# Patient Record
Sex: Female | Born: 1975 | Race: White | Hispanic: No | Marital: Married | Smoking: Never smoker
Health system: Southern US, Community
[De-identification: ages and names within clinical notes are randomized; demographics above are authoritative.]

## PROBLEM LIST (undated history)

## (undated) DIAGNOSIS — G43909 Migraine, unspecified, not intractable, without status migrainosus: Secondary | ICD-10-CM

## (undated) DIAGNOSIS — B20 Human immunodeficiency virus [HIV] disease: Secondary | ICD-10-CM

## (undated) DIAGNOSIS — M358 Other specified systemic involvement of connective tissue: Secondary | ICD-10-CM

## (undated) DIAGNOSIS — E079 Disorder of thyroid, unspecified: Secondary | ICD-10-CM

---

## 2004-10-04 ENCOUNTER — Other Ambulatory Visit: Admission: RE | Admit: 2004-10-04 | Discharge: 2004-10-04 | Payer: Self-pay | Admitting: Obstetrics and Gynecology

## 2005-09-05 ENCOUNTER — Other Ambulatory Visit: Admission: RE | Admit: 2005-09-05 | Discharge: 2005-09-05 | Payer: Self-pay | Admitting: Obstetrics and Gynecology

## 2006-03-27 ENCOUNTER — Inpatient Hospital Stay (HOSPITAL_COMMUNITY): Admission: AD | Admit: 2006-03-27 | Discharge: 2006-03-30 | Payer: Self-pay | Admitting: Obstetrics and Gynecology

## 2006-06-01 ENCOUNTER — Ambulatory Visit (HOSPITAL_COMMUNITY): Admission: RE | Admit: 2006-06-01 | Discharge: 2006-06-01 | Payer: Self-pay | Admitting: Obstetrics and Gynecology

## 2006-06-14 ENCOUNTER — Encounter: Admission: RE | Admit: 2006-06-14 | Discharge: 2006-06-14 | Payer: Self-pay | Admitting: Obstetrics and Gynecology

## 2006-07-17 ENCOUNTER — Encounter: Admission: RE | Admit: 2006-07-17 | Discharge: 2006-07-17 | Payer: Self-pay | Admitting: Endocrinology

## 2008-10-20 ENCOUNTER — Inpatient Hospital Stay (HOSPITAL_COMMUNITY): Admission: AD | Admit: 2008-10-20 | Discharge: 2008-10-22 | Payer: Self-pay | Admitting: Obstetrics and Gynecology

## 2009-02-02 ENCOUNTER — Emergency Department (HOSPITAL_BASED_OUTPATIENT_CLINIC_OR_DEPARTMENT_OTHER): Admission: EM | Admit: 2009-02-02 | Discharge: 2009-02-02 | Payer: Self-pay | Admitting: Emergency Medicine

## 2010-08-30 LAB — COMPREHENSIVE METABOLIC PANEL
ALT: 20 U/L (ref 0–35)
AST: 31 U/L (ref 0–37)
Albumin: 2.9 g/dL — ABNORMAL LOW (ref 3.5–5.2)
Alkaline Phosphatase: 184 U/L — ABNORMAL HIGH (ref 39–117)
CO2: 28 mEq/L (ref 19–32)
Chloride: 101 mEq/L (ref 96–112)
GFR calc non Af Amer: >60 mL/min (ref >60)
Glucose, Bld: 98 mg/dL (ref 70–99)
Potassium: 4.7 mEq/L (ref 3.5–5.1)
Sodium: 137 mEq/L (ref 135–145)

## 2010-08-30 LAB — CBC
HCT: 35.7 % — ABNORMAL LOW (ref 36.0–46.0)
HCT: 36.4 % (ref 36.0–46.0)
Hemoglobin: 12.7 g/dL (ref 12.0–15.0)
MCHC: 35 g/dL (ref 30.0–36.0)
Platelets: 135 10*3/uL — ABNORMAL LOW (ref 150–400)
RBC: 3.47 MIL/uL — ABNORMAL LOW (ref 3.87–5.11)
RDW: 13.3 % (ref 11.5–15.5)
WBC: 9.9 10*3/uL (ref 4.0–10.5)

## 2010-08-31 LAB — CBC
HCT: 42.8 % (ref 36.0–46.0)
MCHC: 35.3 g/dL (ref 30.0–36.0)
RBC: 4.23 MIL/uL (ref 3.87–5.11)
RDW: 13.3 % (ref 11.5–15.5)

## 2012-02-20 ENCOUNTER — Other Ambulatory Visit: Payer: Self-pay | Admitting: Obstetrics and Gynecology

## 2012-02-20 DIAGNOSIS — N6009 Solitary cyst of unspecified breast: Secondary | ICD-10-CM

## 2012-02-23 ENCOUNTER — Ambulatory Visit
Admission: RE | Admit: 2012-02-23 | Discharge: 2012-02-23 | Disposition: A | Discharge status: Home / Self Care | Payer: 59 | Source: Ambulatory Visit | Attending: Obstetrics and Gynecology | Admitting: Obstetrics and Gynecology

## 2012-02-23 ENCOUNTER — Other Ambulatory Visit: Payer: Self-pay | Admitting: Obstetrics and Gynecology

## 2012-02-23 DIAGNOSIS — N6009 Solitary cyst of unspecified breast: Secondary | ICD-10-CM

## 2014-10-11 ENCOUNTER — Encounter (HOSPITAL_BASED_OUTPATIENT_CLINIC_OR_DEPARTMENT_OTHER): Payer: Self-pay

## 2014-10-11 ENCOUNTER — Emergency Department (HOSPITAL_BASED_OUTPATIENT_CLINIC_OR_DEPARTMENT_OTHER)
Admission: EM | Admit: 2014-10-11 | Discharge: 2014-10-12 | Disposition: A | Discharge status: Home / Self Care | Payer: BLUE CROSS/BLUE SHIELD | Attending: Emergency Medicine | Admitting: Emergency Medicine

## 2014-10-11 DIAGNOSIS — Z3202 Encounter for pregnancy test, result negative: Secondary | ICD-10-CM | POA: Insufficient documentation

## 2014-10-11 DIAGNOSIS — R51 Headache: Secondary | ICD-10-CM

## 2014-10-11 DIAGNOSIS — R42 Dizziness and giddiness: Secondary | ICD-10-CM | POA: Diagnosis present

## 2014-10-11 DIAGNOSIS — Z880 Allergy status to penicillin: Secondary | ICD-10-CM | POA: Diagnosis not present

## 2014-10-11 DIAGNOSIS — H81392 Other peripheral vertigo, left ear: Secondary | ICD-10-CM | POA: Diagnosis not present

## 2014-10-11 DIAGNOSIS — Z79899 Other long term (current) drug therapy: Secondary | ICD-10-CM | POA: Insufficient documentation

## 2014-10-11 DIAGNOSIS — G43909 Migraine, unspecified, not intractable, without status migrainosus: Secondary | ICD-10-CM | POA: Diagnosis not present

## 2014-10-11 DIAGNOSIS — Z8639 Personal history of other endocrine, nutritional and metabolic disease: Secondary | ICD-10-CM | POA: Insufficient documentation

## 2014-10-11 DIAGNOSIS — Z7951 Long term (current) use of inhaled steroids: Secondary | ICD-10-CM | POA: Insufficient documentation

## 2014-10-11 DIAGNOSIS — R519 Headache, unspecified: Secondary | ICD-10-CM

## 2014-10-11 HISTORY — DX: Other specified systemic involvement of connective tissue: M35.8

## 2014-10-11 HISTORY — DX: Human immunodeficiency virus (HIV) disease: B20

## 2014-10-11 HISTORY — DX: Migraine, unspecified, not intractable, without status migrainosus: G43.909

## 2014-10-11 HISTORY — DX: Disorder of thyroid, unspecified: E07.9

## 2014-10-11 LAB — PREGNANCY, URINE: Preg Test, Ur: NEGATIVE

## 2014-10-11 LAB — URINALYSIS, ROUTINE W REFLEX MICROSCOPIC
Bilirubin Urine: NEGATIVE
GLUCOSE, UA: NEGATIVE mg/dL
Hgb urine dipstick: NEGATIVE
Nitrite: NEGATIVE
Protein, ur: NEGATIVE mg/dL
SPECIFIC GRAVITY, URINE: 1.023 (ref 1.005–1.030)
UROBILINOGEN UA: 0.2 mg/dL (ref 0.0–1.0)
pH: 7.5 (ref 5.0–8.0)

## 2014-10-11 LAB — URINE MICROSCOPIC-ADD ON

## 2014-10-11 MED ORDER — KETOROLAC TROMETHAMINE 30 MG/ML IJ SOLN
30.0000 mg | Freq: Once | INTRAMUSCULAR | Status: AC
  Administered 2014-10-11: 30 mg via INTRAVENOUS
  Filled 2014-10-11: qty 1

## 2014-10-11 MED ORDER — ONDANSETRON HCL 4 MG/2ML IJ SOLN
4.0000 mg | Freq: Once | INTRAMUSCULAR | Status: AC
Start: 2014-10-11 — End: 2014-10-11
  Administered 2014-10-11: 4 mg via INTRAVENOUS
  Filled 2014-10-11: qty 2

## 2014-10-11 MED ORDER — METOCLOPRAMIDE HCL 5 MG/ML IJ SOLN
10.0000 mg | Freq: Once | INTRAMUSCULAR | Status: AC
  Administered 2014-10-11: 10 mg via INTRAVENOUS
  Filled 2014-10-11: qty 2

## 2014-10-11 MED ORDER — DIPHENHYDRAMINE HCL 50 MG/ML IJ SOLN
12.5000 mg | Freq: Once | INTRAMUSCULAR | Status: AC
  Administered 2014-10-11: 12.5 mg via INTRAVENOUS
  Filled 2014-10-11: qty 1

## 2014-10-11 NOTE — ED Notes (Signed)
Pt reports dizziness and n/v.  States head hurts but does not think this is migraine b/c dizziness started first.  Dizziness worse with movement.

## 2014-10-11 NOTE — ED Provider Notes (Signed)
CSN: [id number]     Arrival date & time 10/11/14  1933 History   First MD Initiated Contact with Patient 10/11/14 2106     Chief Complaint  Patient presents with  . Dizziness     (Consider location/radiation/quality/duration/timing/severity/associated sxs/prior Treatment) HPI   Bridget Ross is a 39 y.o. female complaining of hue to onset of sensation of falling with nausea and vomiting especially when she turns her head to the left onset at 3:30 PM today followed by global headache which she describes as throbbing, she is light sensitive, she rates her pain at 9 out of 10, patient initially tried to take a Motrin but she vomited all medication. Patient states the headache reach maximal intensity within 30 minutes, she states that the headache is typical however it is normally not exacerbated by head movement and she normally doesn't have vomiting and dizziness before her head moves. She denies vertigo but described as a sensation of falling and disequilibrium. Pt denies fever, rash, confusion, cervicalgia, LOC/syncope, change in vision, N/V, numbness, weakness, dysarthria, ataxia, thunderclap onset, exacerbation with exertion or valsalva, exacerbation in morning, CP, SOB, abdominal pain.   Past Medical History  Diagnosis Date  . Migraine   . Thyroid disease   . Autoimmune deficiency syndrome    History reviewed. No pertinent past surgical history. No family history on file. History  Substance Use Topics  . Smoking status: Never Smoker   . Smokeless tobacco: Not on file  . Alcohol Use: No   OB History    No data available     Review of Systems  10 systems reviewed and found to be negative, except as noted in the HPI.   Allergies  Penicillins  Home Medications   Prior to Admission medications   Medication Sig Start Date End Date Taking? Authorizing Provider  cetirizine (ZYRTEC) 10 MG tablet Take 10 mg by mouth daily.   Yes Historical Provider, MD  fluticasone  (FLONASE) 50 MCG/ACT nasal spray Place into both nostrils daily.   Yes Historical Provider, MD  Multiple Vitamin (MULTIVITAMIN) tablet Take 1 tablet by mouth daily.   Yes Historical Provider, MD  metoCLOPramide (REGLAN) 10 MG tablet Take 1 tablet (10 mg total) by mouth every 6 (six) hours as needed for nausea (nausea/headache). 10/12/14   Nicole Pisciotta, PA-C   BP 107/71 mmHg  Pulse 78  Temp(Src) 98.3 F (36.8 C) (Oral)  Resp 16  Ht  (1.727 m)  Wt 125 lb (56.7 kg)  BMI 19.01 kg/m2  SpO2 100%  LMP 09/21/2014 Physical Exam  Constitutional: She is oriented to person, place, and time. She appears well-developed and well-nourished.  HENT:  Head: Normocephalic and atraumatic.  Mouth/Throat: Oropharynx is clear and moist.  Eyes: Conjunctivae and EOM are normal. Pupils are equal, round, and reactive to light.  Neck: Normal range of motion. Neck supple.  FROM to C-spine. Pt can touch chin to chest without discomfort. No TTP of midline cervical spine.   Cardiovascular: Normal rate, regular rhythm and intact distal pulses.   Pulmonary/Chest: Effort normal and breath sounds normal. No respiratory distress. She has no wheezes. She has no rales. She exhibits no tenderness.  Abdominal: Soft. Bowel sounds are normal. There is no tenderness.  Musculoskeletal: Normal range of motion. She exhibits no edema or tenderness.  Neurological: She is alert and oriented to person, place, and time. No cranial nerve deficit.  II-Visual fields grossly intact. III/IV/VI-Extraocular movements intact.  Pupils reactive bilaterally. V/VII-Smile symmetric, equal eyebrow raise,  facial sensation intact VIII- Hearing grossly intact IX/X-Normal gag XI-bilateral shoulder shrug XII-midline tongue extension Motor: 5/5 bilaterally with normal tone and bulk Cerebellar: Normal finger-to-nose  and normal heel-to-shin test.   Romberg negative   Dix-Hallpike is negative bilaterally   Nursing note and vitals  reviewed.   ED Course  Procedures (including critical care time) Labs Review Labs Reviewed  URINALYSIS, ROUTINE W REFLEX MICROSCOPIC - Abnormal; Notable for the following:    Ketones, ur >80 (*)    Leukocytes, UA SMALL (*)    All other components within normal limits  URINE MICROSCOPIC-ADD ON - Abnormal; Notable for the following:    Squamous Epithelial / LPF FEW (*)    Bacteria, UA MANY (*)    All other components within normal limits  PREGNANCY, URINE    Imaging Review No results found.   EKG Interpretation None      MDM   Final diagnoses:  Peripheral vertigo involving left ear  Nonintractable headache, unspecified chronicity pattern, unspecified headache type    Filed Vitals:   10/11/14 2001 10/11/14 2130 10/11/14 2201 10/12/14 0119  BP: 120/82 111/71 117/72 107/71  Pulse: 80 84 85 78  Temp:    98.3 F (36.8 C)  TempSrc:    Oral  Resp: 18 16 18 16   Height:      Weight:      SpO2: 100% 100% 100% 100%    Medications  ondansetron (ZOFRAN) injection 4 mg (4 mg Intravenous Given 10/11/14 2248)  diphenhydrAMINE (BENADRYL) injection 12.5 mg (12.5 mg Intravenous Given 10/11/14 2253)  metoCLOPramide (REGLAN) injection 10 mg (10 mg Intravenous Given 10/11/14 2248)  ketorolac (TORADOL) 30 MG/ML injection 30 mg (30 mg Intravenous Given 10/11/14 2252)    Bridget Ross is a pleasant 39 y.o. female presenting with dizziness which she described as a falling sensation when she turns her head to the left associated with nausea and several episodes of vomiting. Neuro exam is nonfocal, I believe this to be a peripheral vertigo, I think this is setting off a typical migraine for her. After patient is given a headache cocktail she reports complete resolution of her symptoms. I prepped out some information for her on the Apley maneuver patient has a nose and throat physician that she will follow with. We've had an extensive discussion of return precautions and patient verbalizes  her understanding.  Evaluation does not show pathology that would require ongoing emergent intervention or inpatient treatment. Pt is hemodynamically stable and mentating appropriately. Discussed findings and plan with patient/guardian, who agrees with care plan. All questions answered. Return precautions discussed and outpatient follow up given.   Discharge Medication List as of 10/12/2014 12:19 AM    START taking these medications   Details  metoCLOPramide (REGLAN) 10 MG tablet Take 1 tablet (10 mg total) by mouth every 6 (six) hours as needed for nausea (nausea/headache)., Starting 10/12/2014, Until Discontinued, Print             Wynetta Emeryicole Pisciotta, PA-C 10/12/14 1320  Arby BarretteMarcy Heba Ige, MD 10/14/14 1426

## 2014-10-12 MED ORDER — METOCLOPRAMIDE HCL 10 MG PO TABS: 10.0000 mg | ORAL_TABLET | Freq: Four times a day (QID) | ORAL | Status: AC | PRN

## 2014-10-12 NOTE — Discharge Instructions (Signed)
Please follow with your primary care doctor in the next 2 days for a check-up. They must obtain records for further management.  ° °Do not hesitate to return to the Emergency Department for any new, worsening or concerning symptoms.  ° ° °Benign Positional Vertigo °Vertigo means you feel like you or your surroundings are moving when they are not. Benign positional vertigo is the most common form of vertigo. Benign means that the cause of your condition is not serious. Benign positional vertigo is more common in older adults. °CAUSES  °Benign positional vertigo is the result of an upset in the labyrinth system. This is an area in the middle ear that helps control your balance. This may be caused by a viral infection, head injury, or repetitive motion. However, often no specific cause is found. °SYMPTOMS  °Symptoms of benign positional vertigo occur when you move your head or eyes in different directions. Some of the symptoms may include: °1. Loss of balance and falls. °2. Vomiting. °3. Blurred vision. °4. Dizziness. °5. Nausea. °6. Involuntary eye movements (nystagmus). °DIAGNOSIS  °Benign positional vertigo is usually diagnosed by physical exam. If the specific cause of your benign positional vertigo is unknown, your caregiver may perform imaging tests, such as magnetic resonance imaging (MRI) or computed tomography (CT). °TREATMENT  °Your caregiver may recommend movements or procedures to correct the benign positional vertigo. Medicines such as meclizine, benzodiazepines, and medicines for nausea may be used to treat your symptoms. In rare cases, if your symptoms are caused by certain conditions that affect the inner ear, you may need surgery. °HOME CARE INSTRUCTIONS  °· Follow your caregiver's instructions. °· Move slowly. Do not make sudden body or head movements. °· Avoid driving. °· Avoid operating heavy machinery. °· Avoid performing any tasks that would be dangerous to you or others during a vertigo  episode. °· Drink enough fluids to keep your urine clear or pale yellow. °SEEK IMMEDIATE MEDICAL CARE IF:  °· You develop problems with walking, weakness, numbness, or using your arms, hands, or legs. °· You have difficulty speaking. °· You develop severe headaches. °· Your nausea or vomiting continues or gets worse. °· You develop visual changes. °· Your family or friends notice any behavioral changes. °· Your condition gets worse. °· You have a fever. °· You develop a stiff neck or sensitivity to light. °MAKE SURE YOU:  °· Understand these instructions. °· Will watch your condition. °· Will get help right away if you are not doing well or get worse. °Document Released: 02/14/2006 Document Revised: 08/01/2011 Document Reviewed: 01/27/2011 °[employer]® Patient Information ©2015 [employer]. This information is not intended to replace advice given to you by your health care provider. Make sure you discuss any questions you have with your health care provider. ° °Epley Maneuver Self-Care °WHAT IS THE EPLEY MANEUVER? °The Epley maneuver is an exercise you can do to relieve symptoms of benign paroxysmal positional vertigo (BPPV). This condition is often just referred to as vertigo. BPPV is caused by the movement of tiny crystals (canaliths) inside your inner ear. The accumulation and movement of canaliths in your inner ear causes a sudden spinning sensation (vertigo) when you move your head to certain positions. Vertigo usually lasts about 30 seconds. BPPV usually occurs in just one ear. If you get vertigo when you lie on your left side, you probably have BPPV in your left ear. Your health care provider can tell you which ear is involved.  °BPPV may be caused by a   head injury. Many people older than 50 get BPPV for unknown reasons. If you have been diagnosed with BPPV, your health care provider may teach you how to do this maneuver. BPPV is not life threatening (benign) and usually goes away in time.  °WHEN SHOULD I  PERFORM THE EPLEY MANEUVER? °You can do this maneuver at home whenever you have symptoms of vertigo. You may do the Epley maneuver up to 3 times a day until your symptoms of vertigo go away. °HOW SHOULD I DO THE EPLEY MANEUVER? °7. Sit on the edge of a bed or table with your back straight. Your legs should be extended or hanging over the edge of the bed or table.   °8. Turn your head halfway toward the affected ear.   °9. Lie backward quickly with your head turned until you are lying flat on your back. You may want to position a pillow under your shoulders.   °10. Hold this position for 30 seconds. You may experience an attack of vertigo. This is normal. Hold this position until the vertigo stops. °11. Then turn your head to the opposite direction until your unaffected ear is facing the floor.   °12. Hold this position for 30 seconds. You may experience an attack of vertigo. This is normal. Hold this position until the vertigo stops. °13. Now turn your whole body to the same side as your head. Hold for another 30 seconds.   °14. You can then sit back up. °ARE THERE RISKS TO THIS MANEUVER? °In some cases, you may have other symptoms (such as changes in your vision, weakness, or numbness). If you have these symptoms, stop doing the maneuver and call your health care provider. Even if doing these maneuvers relieves your vertigo, you may still have dizziness. Dizziness is the sensation of light-headedness but without the sensation of movement. Even though the Epley maneuver may relieve your vertigo, it is possible that your symptoms will return within 5 years. °WHAT SHOULD I DO AFTER THIS MANEUVER? °After doing the Epley maneuver, you can return to your normal activities. Ask your doctor if there is anything you should do at home to prevent vertigo. This may include: °· Sleeping with two or more pillows to keep your head elevated. °· Not sleeping on the side of your affected ear. °· Getting up slowly from  bed. °· Avoiding sudden movements during the day. °· Avoiding extreme head movement, like looking up or bending over. °· Wearing a cervical collar to prevent sudden head movements. °WHAT SHOULD I DO IF MY SYMPTOMS GET WORSE? °Call your health care provider if your vertigo gets worse. Call your provider right way if you have other symptoms, including:  °· Nausea. °· Vomiting. °· Headache. °· Weakness. °· Numbness. °· Vision changes. °Document Released: 05/14/2013 Document Reviewed: 05/14/2013 °[employer]® Patient Information ©2015 [employer]. This information is not intended to replace advice given to you by your health care provider. Make sure you discuss any questions you have with your health care provider. ° °

## 2020-01-01 ENCOUNTER — Other Ambulatory Visit: Payer: Self-pay | Admitting: Obstetrics and Gynecology

## 2020-01-01 DIAGNOSIS — R928 Other abnormal and inconclusive findings on diagnostic imaging of breast: Secondary | ICD-10-CM

## 2020-01-16 ENCOUNTER — Ambulatory Visit: Admission: RE | Admit: 2020-01-16 | Payer: BC Managed Care – PPO | Source: Ambulatory Visit

## 2020-01-16 ENCOUNTER — Ambulatory Visit
Admission: RE | Admit: 2020-01-16 | Discharge: 2020-01-16 | Disposition: A | Discharge status: Home / Self Care | Payer: BC Managed Care – PPO | Source: Ambulatory Visit | Attending: Obstetrics and Gynecology | Admitting: Obstetrics and Gynecology

## 2020-01-16 ENCOUNTER — Other Ambulatory Visit: Payer: Self-pay

## 2020-01-16 DIAGNOSIS — R928 Other abnormal and inconclusive findings on diagnostic imaging of breast: Secondary | ICD-10-CM

## 2020-01-22 ENCOUNTER — Other Ambulatory Visit: Payer: Self-pay | Admitting: Obstetrics and Gynecology

## 2020-01-22 DIAGNOSIS — Z9189 Other specified personal risk factors, not elsewhere classified: Secondary | ICD-10-CM

## 2021-02-10 ENCOUNTER — Other Ambulatory Visit: Payer: Self-pay | Admitting: Obstetrics and Gynecology

## 2021-02-10 DIAGNOSIS — Z9189 Other specified personal risk factors, not elsewhere classified: Secondary | ICD-10-CM

## 2021-02-21 ENCOUNTER — Other Ambulatory Visit: Payer: Self-pay

## 2021-02-21 ENCOUNTER — Ambulatory Visit
Admission: RE | Admit: 2021-02-21 | Discharge: 2021-02-21 | Disposition: A | Discharge status: Home / Self Care | Payer: No Typology Code available for payment source | Source: Ambulatory Visit | Attending: Obstetrics and Gynecology | Admitting: Obstetrics and Gynecology

## 2021-02-21 DIAGNOSIS — Z9189 Other specified personal risk factors, not elsewhere classified: Secondary | ICD-10-CM

## 2021-02-21 MED ORDER — GADOBUTROL 1 MMOL/ML IV SOLN
7.0000 mL | Freq: Once | INTRAVENOUS | Status: AC | PRN
  Administered 2021-02-21: 7 mL via INTRAVENOUS

## 2021-02-23 ENCOUNTER — Other Ambulatory Visit: Payer: Self-pay | Admitting: Obstetrics and Gynecology

## 2021-02-23 DIAGNOSIS — R9389 Abnormal findings on diagnostic imaging of other specified body structures: Secondary | ICD-10-CM

## 2021-03-04 ENCOUNTER — Other Ambulatory Visit: Payer: Self-pay | Admitting: Radiology

## 2021-03-04 ENCOUNTER — Ambulatory Visit
Admission: RE | Admit: 2021-03-04 | Discharge: 2021-03-04 | Disposition: A | Discharge status: Home / Self Care | Payer: BC Managed Care – PPO | Source: Ambulatory Visit | Attending: Obstetrics and Gynecology | Admitting: Obstetrics and Gynecology

## 2021-03-04 ENCOUNTER — Other Ambulatory Visit: Payer: Self-pay

## 2021-03-04 DIAGNOSIS — R9389 Abnormal findings on diagnostic imaging of other specified body structures: Secondary | ICD-10-CM

## 2021-03-04 MED ORDER — GADOBUTROL 1 MMOL/ML IV SOLN
8.0000 mL | Freq: Once | INTRAVENOUS | Status: AC | PRN
  Administered 2021-03-04: 8 mL via INTRAVENOUS

## 2022-02-16 ENCOUNTER — Other Ambulatory Visit: Payer: Self-pay | Admitting: Obstetrics and Gynecology

## 2022-02-16 DIAGNOSIS — Z803 Family history of malignant neoplasm of breast: Secondary | ICD-10-CM

## 2022-04-28 IMAGING — MR MR BREAST BX W LOC DEV EA ADD LESION IMAGE BX SPEC MR GUIDE*L*
7 of 10 series · 32 of 48 positions shown · IV contrast (8ml gadavist)
Comparison: Previous exams.
COMPARISON: Previous exams.

Addendum:
CLINICAL DATA: The patient presents for MRI guided biopsy an upper
outer left breast mass and an upper inner left breast mass.

EXAM:
MRI GUIDED CORE NEEDLE BIOPSY OF THE LEFT BREAST
TECHNIQUE: Multiplanar, multisequence MR imaging of the breast was performed
both before and after administration of intravenous contrast.
CONTRAST:  8mL GADAVIST GADOBUTROL 1 MMOL/ML IV SOLN

[Series 2: fiducial unilateral · sagittal · 2.0mm · 1.33mm/px · 3 of 52 slices shown]
[im 1/52]
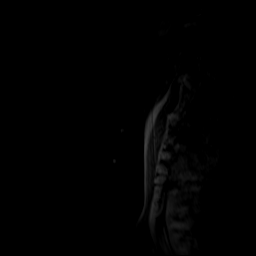
[im 26/52]
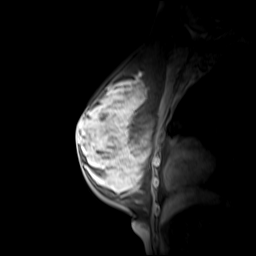
[im 52/52]
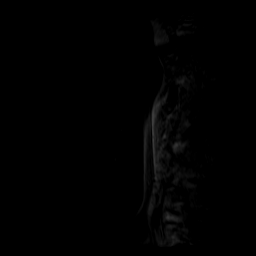

[Series 3: dynamic pre · axial · non-contrast · 1.3mm · 0.73mm/px · z∈[-92,+93]mm · 5 of 144 slices shown]
[im 1/144]
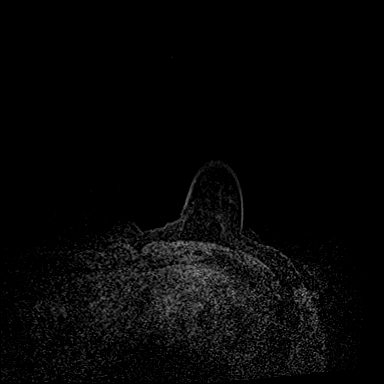
[im 36/144]
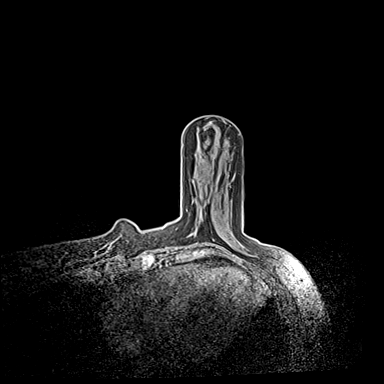
[im 72/144]
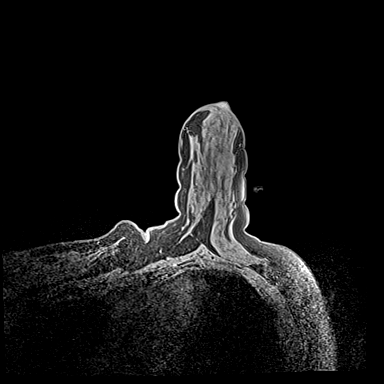
[im 108/144]
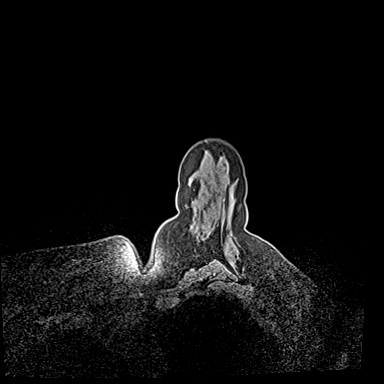
[im 144/144]
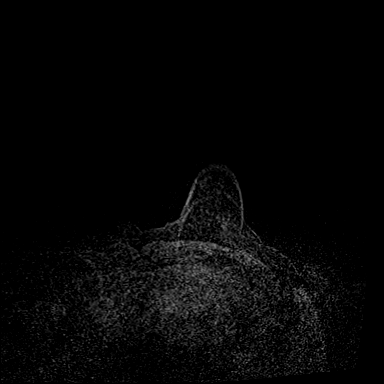

[Series 4: dynamic post 20 · axial · 1.3mm · 0.73mm/px · z∈[-92,+93]mm · 5 of 144 slices shown (1 of 2)]
[im 1/144]
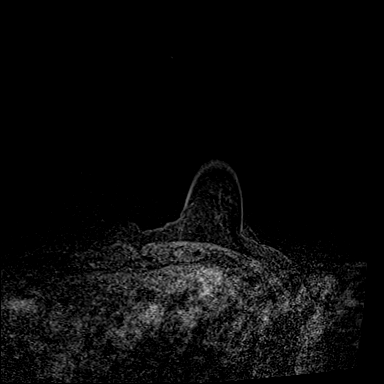
[im 36/144]
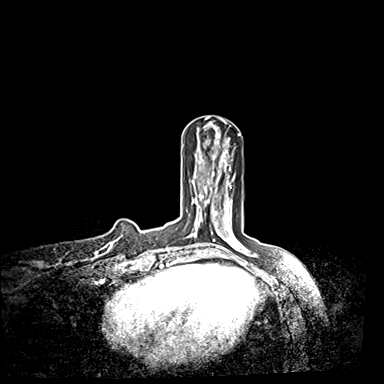
[im 72/144]
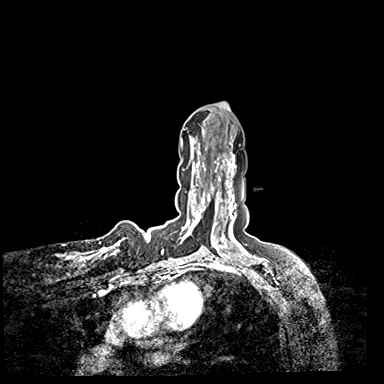
[im 108/144]
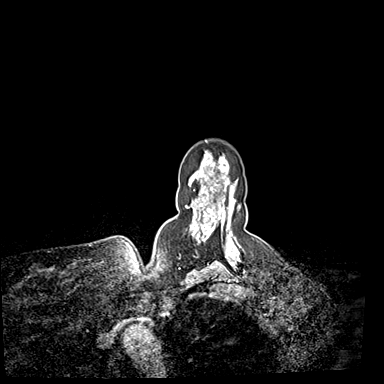
[im 144/144]
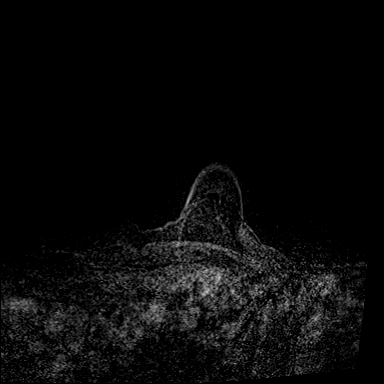

[Series 5: dynamic post 20 · axial · 1.3mm · 0.73mm/px · z∈[-92,+93]mm · 5 of 144 slices shown (2 of 2)]
[im 1/144]
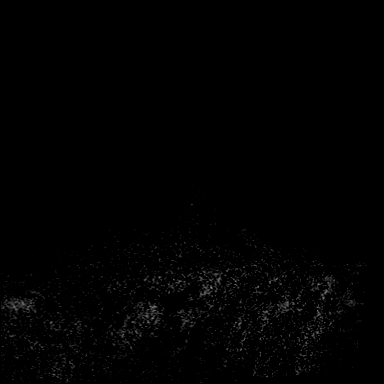
[im 36/144]
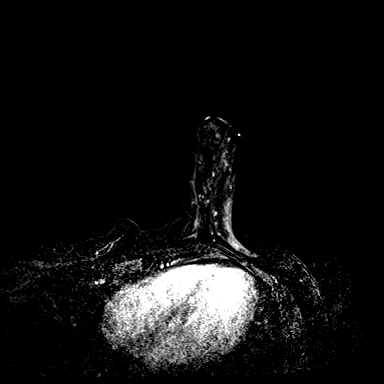
[im 72/144]
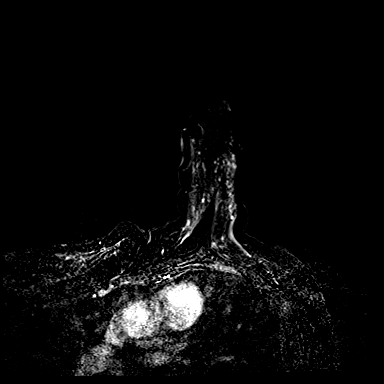
[im 108/144]
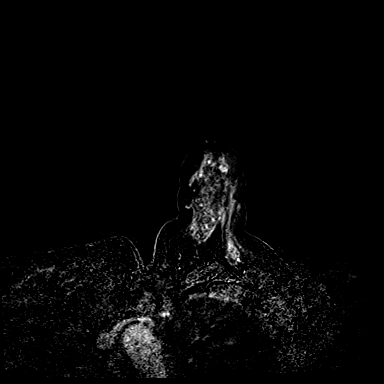
[im 144/144]
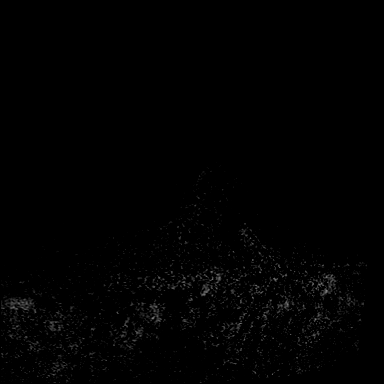

[Series 6: dynamic post 3 · axial · 1.3mm · 0.73mm/px · z∈[-92,+93]mm · 5 of 144 slices shown (1 of 2)]
[im 1/144]
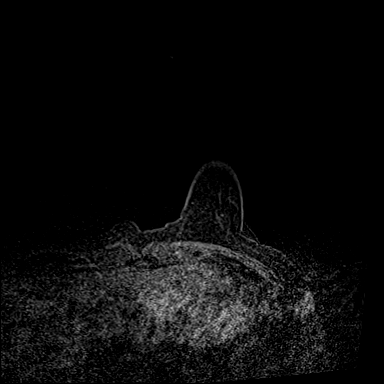
[im 36/144]
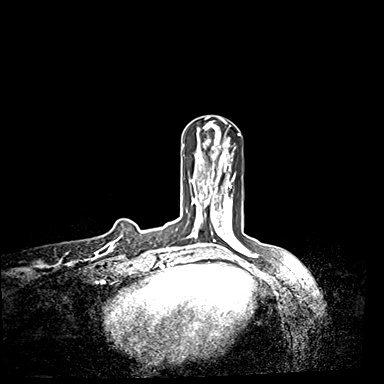
[im 72/144]
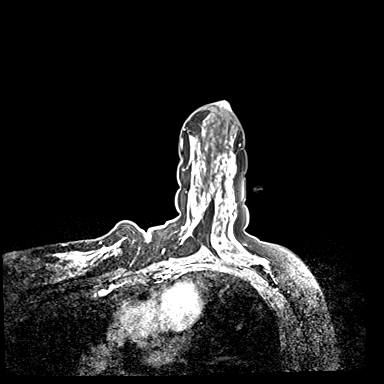
[im 108/144]
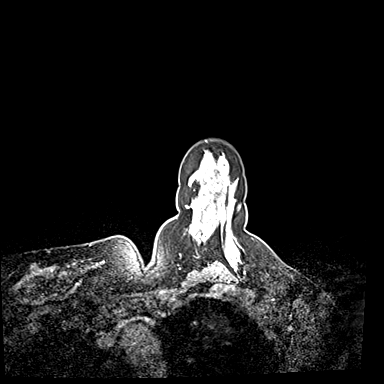
[im 144/144]
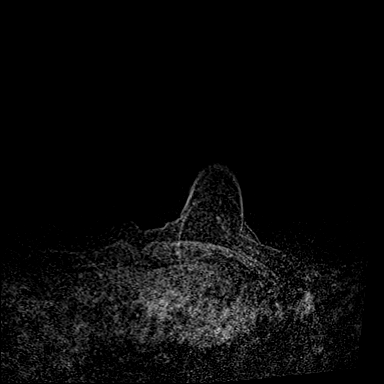

[Series 7: dynamic post 3 · axial · 1.3mm · 0.73mm/px · z∈[-92,+93]mm · 5 of 144 slices shown (2 of 2)]
[im 1/144]
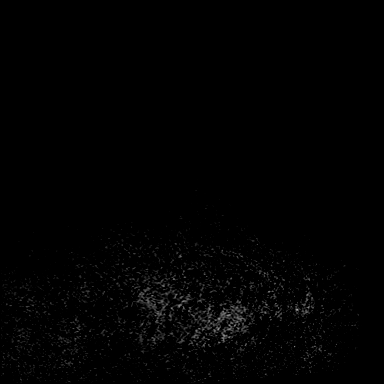
[im 36/144]
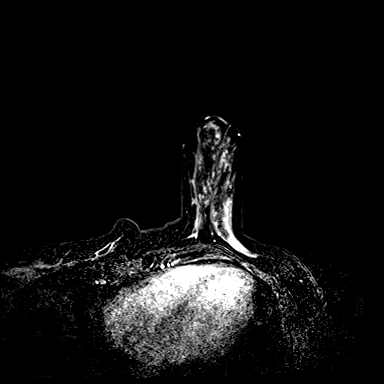
[im 72/144]
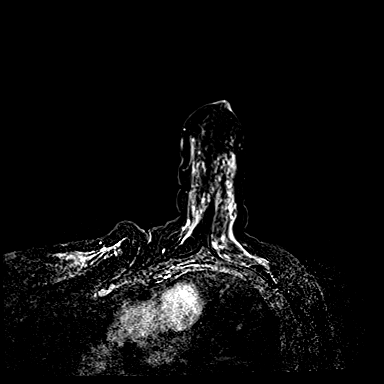
[im 108/144]
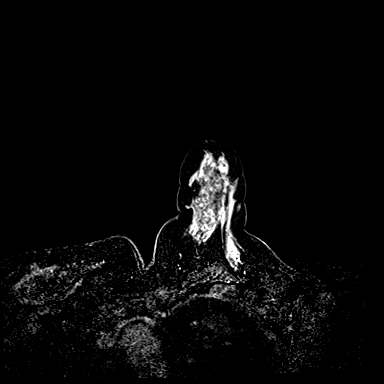
[im 144/144]
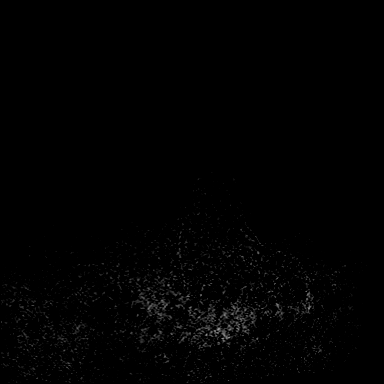

[Series 8: needle confirmation · axial · 1.3mm · 0.73mm/px · z∈[-92,+47]mm · 4 of 144 slices shown]
[im 1/144]
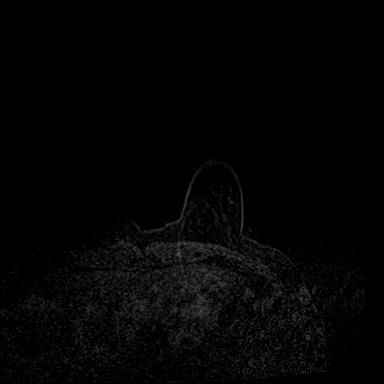
[im 36/144]
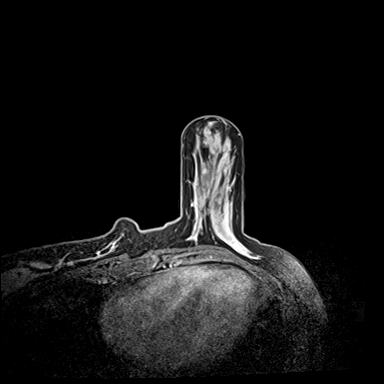
[im 72/144]
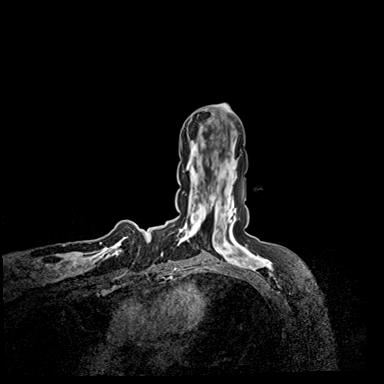
[im 108/144]
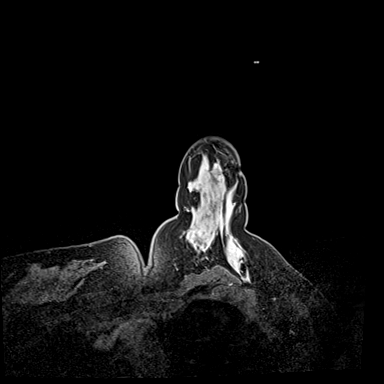

[32 of 48 positions shown; findings below may reference images not displayed]

FINDINGS: I met with the patient, and we discussed the procedure of MRI guided
biopsy, including risks, benefits, and alternatives. Specifically,
we discussed the risks of infection, bleeding, tissue injury, clip
migration, and inadequate sampling. Informed, written consent was
given. The usual time out protocol was performed immediately prior
to the procedure.

Using sterile technique, 1% Lidocaine, MRI guidance, and a 9 gauge
vacuum assisted device, biopsy was performed of the upper-outer left
breast mass using a lateral approach. At the conclusion of the
procedure, a cylinder shaped tissue marker clip was deployed into
the biopsy cavity. Follow-up 2-view mammogram was performed and
dictated separately.

Using sterile technique, 1% Lidocaine, MRI guidance, and a 9 gauge
vacuum assisted device, biopsy was performed of the upper inner left
breast mass using a medial approach. At the conclusion of the
procedure, a dumbbell shaped tissue marker clip was deployed into
the biopsy cavity.

Follow-up 2-view mammogram was performed and dictated separately.
IMPRESSION: MRI guided biopsy of left breast biopsies. No apparent
complications.

ADDENDUM:
Pathology revealed FIBROADENOMA of the LEFT breast, upper outer,
(cylinder clip). This was found to be concordant by Dr. Tamara
Yean Carlos.

Pathology revealed FIBROADENOMA of the LEFT breast, upper inner,
(dumbbell clip). This was found to be concordant by Dr. Tamara
Yean Carlos.

Pathology results were discussed with the patient by telephone by
Jerrid Awan, RN Nurse Navigator. The patient reported doing well
after the biopsies with bleeding, bruising and tenderness at the
sites. Post biopsy instructions and care were reviewed and questions
were answered. The patient was encouraged to call The [REDACTED]

The patient was instructed to return for a bilateral breast MRI in 6
months, per protocol, and to continue with annual screening
mammography as well as annual screening MRI, given high risk.

Pathology results reported by Zvani Orhan, RN on 03/08/2021.

*** End of Addendum ***
FINDINGS: I met with the patient, and we discussed the procedure of MRI guided
biopsy, including risks, benefits, and alternatives. Specifically,
we discussed the risks of infection, bleeding, tissue injury, clip
migration, and inadequate sampling. Informed, written consent was
given. The usual time out protocol was performed immediately prior
to the procedure.

Using sterile technique, 1% Lidocaine, MRI guidance, and a 9 gauge
vacuum assisted device, biopsy was performed of the upper-outer left
breast mass using a lateral approach. At the conclusion of the
procedure, a cylinder shaped tissue marker clip was deployed into
the biopsy cavity. Follow-up 2-view mammogram was performed and
dictated separately.

Using sterile technique, 1% Lidocaine, MRI guidance, and a 9 gauge
vacuum assisted device, biopsy was performed of the upper inner left
breast mass using a medial approach. At the conclusion of the
procedure, a dumbbell shaped tissue marker clip was deployed into
the biopsy cavity.

Follow-up 2-view mammogram was performed and dictated separately.
IMPRESSION: MRI guided biopsy of left breast biopsies. No apparent
complications.

## 2022-05-09 ENCOUNTER — Ambulatory Visit: Payer: BC Managed Care – PPO

## 2022-05-12 ENCOUNTER — Encounter: Payer: Self-pay | Admitting: Obstetrics and Gynecology

## 2022-05-12 DIAGNOSIS — Z803 Family history of malignant neoplasm of breast: Secondary | ICD-10-CM

## 2022-06-01 ENCOUNTER — Ambulatory Visit
Admission: RE | Admit: 2022-06-01 | Discharge: 2022-06-01 | Disposition: A | Discharge status: Home / Self Care | Payer: No Typology Code available for payment source | Source: Ambulatory Visit | Attending: Obstetrics and Gynecology | Admitting: Obstetrics and Gynecology

## 2022-06-01 DIAGNOSIS — Z803 Family history of malignant neoplasm of breast: Secondary | ICD-10-CM

## 2022-06-01 MED ORDER — GADOPICLENOL 0.5 MMOL/ML IV SOLN
7.0000 mL | Freq: Once | INTRAVENOUS | Status: AC | PRN
  Administered 2022-06-01: 7 mL via INTRAVENOUS

## 2022-06-02 ENCOUNTER — Other Ambulatory Visit: Payer: Self-pay | Admitting: Obstetrics and Gynecology

## 2022-06-02 DIAGNOSIS — R9389 Abnormal findings on diagnostic imaging of other specified body structures: Secondary | ICD-10-CM

## 2022-06-16 ENCOUNTER — Ambulatory Visit: Payer: BC Managed Care – PPO

## 2022-06-16 ENCOUNTER — Other Ambulatory Visit: Payer: Self-pay | Admitting: Obstetrics and Gynecology

## 2022-06-16 ENCOUNTER — Ambulatory Visit
Admission: RE | Admit: 2022-06-16 | Discharge: 2022-06-16 | Disposition: A | Discharge status: Home / Self Care | Payer: BC Managed Care – PPO | Source: Ambulatory Visit | Attending: Obstetrics and Gynecology | Admitting: Obstetrics and Gynecology

## 2022-06-16 DIAGNOSIS — R9389 Abnormal findings on diagnostic imaging of other specified body structures: Secondary | ICD-10-CM

## 2022-06-16 MED ORDER — GADOPICLENOL 0.5 MMOL/ML IV SOLN
6.0000 mL | Freq: Once | INTRAVENOUS | Status: AC | PRN
  Administered 2022-06-16: 6 mL via INTRAVENOUS
